# Patient Record
Sex: Female | Born: 1970 | Race: White | Hispanic: No | Marital: Married | Smoking: Never smoker
Health system: Southern US, Community
[De-identification: ages and names within clinical notes are randomized; demographics above are authoritative.]

## PROBLEM LIST (undated history)

## (undated) DIAGNOSIS — D649 Anemia, unspecified: Secondary | ICD-10-CM

## (undated) DIAGNOSIS — D242 Benign neoplasm of left breast: Secondary | ICD-10-CM

## (undated) DIAGNOSIS — Z87440 Personal history of urinary (tract) infections: Secondary | ICD-10-CM

## (undated) DIAGNOSIS — N939 Abnormal uterine and vaginal bleeding, unspecified: Secondary | ICD-10-CM

## (undated) HISTORY — DX: Abnormal uterine and vaginal bleeding, unspecified: N93.9

## (undated) HISTORY — DX: Benign neoplasm of left breast: D24.2

## (undated) HISTORY — PX: BREAST EXCISIONAL BIOPSY: SUR124

## (undated) HISTORY — DX: Personal history of urinary (tract) infections: Z87.440

## (undated) HISTORY — PX: BREAST SURGERY: SHX581

## (undated) HISTORY — DX: Anemia, unspecified: D64.9

---
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
## 2015-07-05 ENCOUNTER — Other Ambulatory Visit: Payer: Self-pay

## 2015-07-05 DIAGNOSIS — Z1231 Encounter for screening mammogram for malignant neoplasm of breast: Secondary | ICD-10-CM

## 2015-08-09 ENCOUNTER — Ambulatory Visit: Payer: Self-pay

## 2015-08-23 ENCOUNTER — Ambulatory Visit
Admission: RE | Admit: 2015-08-23 | Discharge: 2015-08-23 | Disposition: A | Discharge status: Home / Self Care | Payer: No Typology Code available for payment source | Source: Ambulatory Visit

## 2015-08-23 DIAGNOSIS — Z1231 Encounter for screening mammogram for malignant neoplasm of breast: Secondary | ICD-10-CM

## 2015-11-01 ENCOUNTER — Encounter: Payer: Self-pay | Admitting: Obstetrics & Gynecology

## 2015-11-01 ENCOUNTER — Ambulatory Visit (INDEPENDENT_AMBULATORY_CARE_PROVIDER_SITE_OTHER): Payer: No Typology Code available for payment source | Admitting: Obstetrics & Gynecology

## 2015-11-01 ENCOUNTER — Encounter: Payer: No Typology Code available for payment source | Admitting: Obstetrics & Gynecology

## 2015-11-01 VITALS — BP 118/62 | HR 80 | Temp 98.2°F | Resp 16 | Ht 65.25 in | Wt 156.0 lb

## 2015-11-01 DIAGNOSIS — Z124 Encounter for screening for malignant neoplasm of cervix: Secondary | ICD-10-CM

## 2015-11-01 DIAGNOSIS — N926 Irregular menstruation, unspecified: Secondary | ICD-10-CM

## 2015-11-01 DIAGNOSIS — M545 Low back pain: Secondary | ICD-10-CM | POA: Diagnosis not present

## 2015-11-01 LAB — FOLLICLE STIMULATING HORMONE: FSH: 9.4 m[IU]/mL

## 2015-11-01 LAB — TSH: TSH: 1.56 m[IU]/L

## 2015-11-01 LAB — HCG, SERUM, QUALITATIVE: Preg, Serum: NEGATIVE

## 2015-11-01 LAB — POCT URINE PREGNANCY: Preg Test, Ur: NEGATIVE

## 2015-11-01 NOTE — Patient Instructions (Signed)
[facility]: Almedia Balls MD  [address] [place], [place] [address]  [phone] 11/04/15 at [facility]. Deloris Ping will call you. 11:45 Check in

## 2015-11-01 NOTE — Progress Notes (Signed)
45 y.o. [id number] MarriedCaucasianF here for new patient visit and for episode of irregular cycle this month.  Pt reports her last normal menstrual cycle was 10/15/15.  This cycle lasted 4-5 days.  This is normal for her and her cycles are normally 28 days.  Then yesterday, her bleeding started back and she is having dark, old looking blood with clotting.  Also, about three weeks ago, she started having having severe low back pain.  She's having trouble with lifting, moving in bed, housework, even standing.  She has radiation of pain down outer thighs.  Pt also has fatigue and malaise over the last two weeks.  She denies fever.  Reports urinary urgency as well.  Patient's last menstrual period was 10/15/2015.          Sexually active: Yes.    The current method of family planning is condoms Every time.    Exercising: No.  The patient does not participate in regular exercise at present. Smoker:  no  Health Maintenance: Pap:  2007 Normal  History of abnormal Pap:  no MMG:  08/23/15 BIRADS1:neg Colonoscopy:  Never BMD:   Never TDaP:  UTD Screening Labs: ?, Urine today: neg   reports that she has never smoked. She has never used smokeless tobacco. She reports that she does not drink alcohol or use illicit drugs.  Past Medical History  Diagnosis Date  . Abnormal uterine bleeding   . Anemia   . 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 History of recurrent UTI (urinary tract infection)     Past Surgical History  Procedure Laterality Date  . Breast surgery Left 1990's    Fibroadenoma     Current Outpatient Prescriptions  Medication Sig Dispense Refill  . Multiple Vitamin (MULTIVITAMIN) tablet Take 1 tablet by mouth daily.     No current facility-administered medications for this visit.    Family History  Problem Relation Age of Onset  . Diabetes Mother   . Hypertension Mother   . Heart disease Father     ROS:  Pertinent items are noted in HPI.  Otherwise, a comprehensive ROS was  negative.  Exam:   BP 118/62 mmHg  Pulse 80  Temp(Src) 98.2 F (36.8 C) (Oral)  Resp 16  Ht 5' 5.25" (1.657 m)  Wt 156 lb (70.761 kg)  BMI 25.77 kg/m2  LMP 10/15/2015    Height: 5' 5.25" (165.7 cm)  Ht Readings from Last 3 Encounters:  11/01/15 5' 5.25" (1.657 m)    General appearance: alert, cooperative and appears stated age Head: Normocephalic, without obvious abnormality, atraumatic Neck: no adenopathy, supple, symmetrical, trachea midline and thyroid normal to inspection and palpation Lungs: clear to auscultation bilaterally Heart: regular rate and rhythm Abdomen: soft, non-tender; bowel sounds normal; no masses,  no organomegaly Extremities: extremities normal, atraumatic, no cyanosis or edema Skin: Skin color, texture, turgor normal. No rashes or lesions Lymph nodes: Cervical, supraclavicular, and axillary nodes normal. No abnormal inguinal nodes palpated Neurologic: Grossly normal   Pelvic: External genitalia:  no lesions              Urethra:  normal appearing urethra with no masses, tenderness or lesions              Bartholins and Skenes: normal                 Vagina: normal appearing vagina with normal color and discharge, no lesions  Cervix: no lesions              Pap taken: Yes.   Bimanual Exam:  Uterus:  normal size, contour, position, consistency, mobility, non-tender              Adnexa: normal adnexa and no mass, fullness, tenderness               Rectovaginal: Confirms               Anus:  normal sphincter tone, no lesions  Chaperone was DECLINED by pt.  A:  DUB Low back pain Fatigue and malaise for two weeks Mild anemia Urinary urgency, negative u/a today  P:   Mammogram yearly discussed pap smear with HR HPV obtained today FSH, TSH Return for PUS tomorrow Referral to Dr. Lynann Bologna.  Suspect low back issue is cause of pain and possibly urinary urgency. return annually or prn

## 2015-11-01 NOTE — Progress Notes (Signed)
Patient is scheduled to see Dr. Lynann Bologna 11-04-15 at 0830.  Ultrasound with Dr. Sabra Heck tomorrow at 1200.

## 2015-11-02 ENCOUNTER — Telehealth: Payer: Self-pay

## 2015-11-02 ENCOUNTER — Ambulatory Visit (INDEPENDENT_AMBULATORY_CARE_PROVIDER_SITE_OTHER): Payer: No Typology Code available for payment source | Admitting: Obstetrics & Gynecology

## 2015-11-02 ENCOUNTER — Ambulatory Visit (INDEPENDENT_AMBULATORY_CARE_PROVIDER_SITE_OTHER): Payer: No Typology Code available for payment source

## 2015-11-02 ENCOUNTER — Encounter: Payer: Self-pay | Admitting: Obstetrics & Gynecology

## 2015-11-02 VITALS — BP 116/78 | HR 88 | Resp 16 | Ht 65.25 in | Wt 154.0 lb

## 2015-11-02 DIAGNOSIS — N926 Irregular menstruation, unspecified: Secondary | ICD-10-CM

## 2015-11-02 DIAGNOSIS — M545 Low back pain: Secondary | ICD-10-CM

## 2015-11-02 DIAGNOSIS — D251 Intramural leiomyoma of uterus: Secondary | ICD-10-CM | POA: Diagnosis not present

## 2015-11-02 MED ORDER — NORETHINDRONE ACETATE 5 MG PO TABS: 10.0000 mg | ORAL_TABLET | Freq: Two times a day (BID) | ORAL | Status: AC

## 2015-11-02 NOTE — Telephone Encounter (Signed)
Spoke with patient at time of incoming call. Patient was seen in the office today for PUS with Nancy Barrera. States she was advised to take Aygestin 5 mg 1 tablet twice per day. When she picked up her prescription it states she needs to take Aygestin 5 mg take 2 tablets twice per day. Patient would like to clarify instructions. Advised I will speak with Dr.Miller and return call with additional recommendations. She is agreeable.

## 2015-11-02 NOTE — Progress Notes (Signed)
45 y.o. [id number] Married [place] female here for pelvic ultrasound due to DUB with menorrhagia this month.  Pt seen on yesterday with normal FSH, FSH and neg UPT.  She continues with bleeding today.  She denies SOB, dizziness, palpitations, or other new symptoms.  She is accompanied by her husband today.  Patient's last menstrual period was 10/15/2015.  Contraception: condom use  Findings:  UTERUS: 9.0 x 4.9 x 4.7cm with 3.0 x 2.4cm fibroids and 1.2 x 0.8cm fibroid EMS:3.67mm, symmetric ADNEXA: Left ovary: 2.9 x 1.7 x 1.6cm       Right ovary: 3.0 x 2.3 x 1.4cm CUL DE SAC: no free fluid  Discussion:  Images and findings reviewed with pt and her spouse.  As ultrasound and blood work were normal will start on Aygestin to try and get bleeding to stop.  She will continue this dosage until rx is finished.  At that point, she will stop the Aygestin and is to expect a menstrual cycle.  Will then need to see if this occurs again or if an isolated episode.  She may need to be on micronor or other OCP to control cycles if this occurs again.  Pt and spouse voice understanding.  Assessment:  DUB Uterine fibroids but otherwise normal PUS.  Do not feel biopsy is needed.  Plan:  Norethindrone 10mg  bid until rx is completed.  Then expect cycle.  Follow up 1 month. Pap result still pending.  ~15 minutes spent with patient >50% of time was in face to face discussion of above.

## 2015-11-02 NOTE — Telephone Encounter (Signed)
Spoke with patient. Advised of message as seen below from Dr.Miller. She is agreeable and verbalizes understanding.  Routing to provider for final review. Patient agreeable to disposition. Will close encounter.  

## 2015-11-02 NOTE — Telephone Encounter (Signed)
RX was for 10mg  bid so they may substitute with two 5mg  tabs.

## 2015-11-04 NOTE — Addendum Note (Signed)
Addended by: Megan Salon on: 11/04/2015 05:13 PM   Modules accepted: Orders

## 2015-11-09 LAB — IPS PAP TEST WITH HPV

## 2015-11-11 ENCOUNTER — Telehealth: Payer: Self-pay | Admitting: *Deleted

## 2015-11-11 NOTE — Telephone Encounter (Signed)
Notes Recorded by Elroy Channel, CMA on 11/11/2015 at 10:26 AM LM for pt to call back. Notes Recorded by Megan Salon, MD on 11/09/2015 at 5:34 PM Please let pt know that the HR HPV testing was negative. However, her pap could not be read due to blood. She should have a follow-up in one month for recheck of bleeding. I will repeat the pap then. 08 recall for one month please.

## 2015-11-11 NOTE — Telephone Encounter (Signed)
08 recall for f/u pap entered.

## 2015-11-12 ENCOUNTER — Encounter: Payer: Self-pay | Admitting: Obstetrics & Gynecology

## 2015-11-12 DIAGNOSIS — D251 Intramural leiomyoma of uterus: Secondary | ICD-10-CM | POA: Insufficient documentation

## 2015-11-15 NOTE — Telephone Encounter (Signed)
LM for pt to call back. Second attempt  

## 2015-11-15 NOTE — Telephone Encounter (Signed)
Pt notified. Verbalized understanding. Patient will call to schedule repeat pap appt.

## 2016-01-27 ENCOUNTER — Telehealth: Payer: Self-pay | Admitting: *Deleted

## 2016-01-27 NOTE — Telephone Encounter (Signed)
Called patient to follow up on repeat pap. Patient in office on 11/01/15 and pap smear was HR HPV negative, but unable to read due to blood. Dr. Sabra Heck wanted patient to have one month follow up. Multiple attempts made to contact patient and she stated she would call office to schedule. When speaking with patient today, she asked if she would be charged for visit and RN stated to patient she would have to pay her co pay, but would not be charged for repeat pap smear. Patient stated, " I don't need it, I have no problems today, I will come later." RN reiterated the fact that Dr. Sabra Heck wanted to repeat the pap smear, but patient again stated, "I have no problems, I will come later."

## 2016-01-27 NOTE — Telephone Encounter (Signed)
Please contact patient in regards to recall. She needs a PAP only appointment Thanks Margaretha Sheffield

## 2016-01-27 NOTE — Telephone Encounter (Signed)
Dr.Miller, please see message below.  Please advise on recall status Thanks Margaretha Sheffield

## 2016-01-28 ENCOUNTER — Encounter: Payer: Self-pay | Admitting: *Deleted

## 2016-01-28 NOTE — Telephone Encounter (Signed)
Please send letter that is now signed.

## 2016-01-28 NOTE — Telephone Encounter (Signed)
Reviewed with Dr Sabra Heck. Letter to your office for review.

## 2016-01-28 NOTE — Telephone Encounter (Signed)
She needs a letter about this.  Will route to sally to follow if pt returns after letter sent.  Ok to remove recall.

## 2017-02-23 ENCOUNTER — Other Ambulatory Visit: Payer: Self-pay | Admitting: Family Medicine

## 2017-02-23 DIAGNOSIS — Z1231 Encounter for screening mammogram for malignant neoplasm of breast: Secondary | ICD-10-CM

## 2017-03-12 ENCOUNTER — Ambulatory Visit
Admission: RE | Admit: 2017-03-12 | Discharge: 2017-03-12 | Disposition: A | Discharge status: Home / Self Care | Payer: No Typology Code available for payment source | Source: Ambulatory Visit | Attending: Family Medicine | Admitting: Family Medicine

## 2017-03-12 ENCOUNTER — Other Ambulatory Visit: Payer: Self-pay | Admitting: Physician Assistant

## 2017-03-12 DIAGNOSIS — Z1231 Encounter for screening mammogram for malignant neoplasm of breast: Secondary | ICD-10-CM

## 2018-02-11 ENCOUNTER — Other Ambulatory Visit: Payer: Self-pay | Admitting: Physician Assistant

## 2018-02-11 DIAGNOSIS — Z1231 Encounter for screening mammogram for malignant neoplasm of breast: Secondary | ICD-10-CM

## 2018-03-14 ENCOUNTER — Ambulatory Visit
Admission: RE | Admit: 2018-03-14 | Discharge: 2018-03-14 | Disposition: A | Discharge status: Home / Self Care | Payer: No Typology Code available for payment source | Source: Ambulatory Visit | Attending: Physician Assistant | Admitting: Physician Assistant

## 2018-03-14 ENCOUNTER — Other Ambulatory Visit: Payer: Self-pay | Admitting: Physician Assistant

## 2018-03-14 DIAGNOSIS — Z1231 Encounter for screening mammogram for malignant neoplasm of breast: Secondary | ICD-10-CM

## 2018-03-15 ENCOUNTER — Other Ambulatory Visit: Payer: Self-pay | Admitting: Physician Assistant

## 2018-03-15 DIAGNOSIS — R928 Other abnormal and inconclusive findings on diagnostic imaging of breast: Secondary | ICD-10-CM

## 2018-03-19 ENCOUNTER — Inpatient Hospital Stay
Admission: RE | Admit: 2018-03-19 | Discharge: 2018-03-19 | Disposition: A | Discharge status: Home / Self Care | Payer: No Typology Code available for payment source | Source: Ambulatory Visit | Attending: Physician Assistant | Admitting: Physician Assistant

## 2018-03-25 ENCOUNTER — Other Ambulatory Visit: Payer: Self-pay | Admitting: Physician Assistant

## 2018-03-25 ENCOUNTER — Ambulatory Visit
Admission: RE | Admit: 2018-03-25 | Discharge: 2018-03-25 | Disposition: A | Discharge status: Home / Self Care | Payer: No Typology Code available for payment source | Source: Ambulatory Visit | Attending: Physician Assistant | Admitting: Physician Assistant

## 2018-03-25 DIAGNOSIS — R928 Other abnormal and inconclusive findings on diagnostic imaging of breast: Secondary | ICD-10-CM

## 2018-03-25 DIAGNOSIS — N63 Unspecified lump in unspecified breast: Secondary | ICD-10-CM

## 2018-09-25 ENCOUNTER — Other Ambulatory Visit: Payer: No Typology Code available for payment source

## 2019-04-14 ENCOUNTER — Other Ambulatory Visit: Payer: Self-pay

## 2019-04-14 ENCOUNTER — Encounter (HOSPITAL_COMMUNITY): Payer: Self-pay | Admitting: Emergency Medicine

## 2019-04-14 ENCOUNTER — Emergency Department (HOSPITAL_COMMUNITY): Payer: No Typology Code available for payment source

## 2019-04-14 ENCOUNTER — Emergency Department (HOSPITAL_COMMUNITY)
Admission: EM | Admit: 2019-04-14 | Discharge: 2019-04-14 | Disposition: A | Discharge status: Home / Self Care | Payer: No Typology Code available for payment source | Attending: Emergency Medicine | Admitting: Emergency Medicine

## 2019-04-14 DIAGNOSIS — R112 Nausea with vomiting, unspecified: Secondary | ICD-10-CM | POA: Diagnosis present

## 2019-04-14 DIAGNOSIS — Z20828 Contact with and (suspected) exposure to other viral communicable diseases: Secondary | ICD-10-CM | POA: Diagnosis not present

## 2019-04-14 DIAGNOSIS — Z79899 Other long term (current) drug therapy: Secondary | ICD-10-CM | POA: Diagnosis not present

## 2019-04-14 DIAGNOSIS — K529 Noninfective gastroenteritis and colitis, unspecified: Secondary | ICD-10-CM | POA: Insufficient documentation

## 2019-04-14 LAB — CBC
HCT: 37.4 % (ref 36.0–46.0)
Hemoglobin: 11.7 g/dL — ABNORMAL LOW (ref 12.0–15.0)
MCH: 24.4 pg — ABNORMAL LOW (ref 26.0–34.0)
MCHC: 31.3 g/dL (ref 30.0–36.0)
MCV: 78.1 fL — ABNORMAL LOW (ref 80.0–100.0)
Platelets: 227 10*3/uL (ref 150–400)
RBC: 4.79 MIL/uL (ref 3.87–5.11)
RDW: 14.6 % (ref 11.5–15.5)
WBC: 15.2 10*3/uL — ABNORMAL HIGH (ref 4.0–10.5)
nRBC: 0 % (ref 0.0–0.2)

## 2019-04-14 LAB — COMPREHENSIVE METABOLIC PANEL
ALT: 24 U/L (ref 0–44)
AST: 22 U/L (ref 15–41)
Albumin: 3.7 g/dL (ref 3.5–5.0)
Alkaline Phosphatase: 43 U/L (ref 38–126)
Anion gap: 9 (ref 5–15)
BUN: 14 mg/dL (ref 6–20)
CO2: 24 mmol/L (ref 22–32)
Calcium: 8.4 mg/dL — ABNORMAL LOW (ref 8.9–10.3)
Chloride: 102 mmol/L (ref 98–111)
Creatinine, Ser: 0.84 mg/dL (ref 0.44–1.00)
GFR calc Af Amer: >60 mL/min (ref >60)
GFR calc non Af Amer: >60 mL/min (ref >60)
Glucose, Bld: 133 mg/dL — ABNORMAL HIGH (ref 70–99)
Potassium: 2.8 mmol/L — ABNORMAL LOW (ref 3.5–5.1)
Sodium: 135 mmol/L (ref 135–145)
Total Bilirubin: 1 mg/dL (ref 0.3–1.2)
Total Protein: 7.5 g/dL (ref 6.5–8.1)

## 2019-04-14 LAB — I-STAT BETA HCG BLOOD, ED (MC, WL, AP ONLY): I-stat hCG, quantitative: <5 m[IU]/mL (ref <5)

## 2019-04-14 LAB — LIPASE, BLOOD: Lipase: 31 U/L (ref 11–51)

## 2019-04-14 LAB — SARS CORONAVIRUS 2 BY RT PCR (HOSPITAL ORDER, PERFORMED IN ~~LOC~~ HOSPITAL LAB): SARS Coronavirus 2: NEGATIVE

## 2019-04-14 MED ORDER — ONDANSETRON HCL 4 MG/2ML IJ SOLN
4.0000 mg | Freq: Once | INTRAMUSCULAR | Status: DC
  Filled 2019-04-14: qty 2

## 2019-04-14 MED ORDER — SODIUM CHLORIDE (PF) 0.9 % IJ SOLN
INTRAMUSCULAR | Status: AC
  Filled 2019-04-14: qty 50

## 2019-04-14 MED ORDER — SODIUM CHLORIDE 0.9% FLUSH: 3.0000 mL | Freq: Once | INTRAVENOUS | Status: DC

## 2019-04-14 MED ORDER — SODIUM CHLORIDE 0.9 % IV BOLUS
1000.0000 mL | Freq: Once | INTRAVENOUS | Status: AC
  Administered 2019-04-14: 1000 mL via INTRAVENOUS

## 2019-04-14 MED ORDER — IOHEXOL 300 MG/ML  SOLN
100.0000 mL | Freq: Once | INTRAMUSCULAR | Status: AC | PRN
  Administered 2019-04-14: 03:00:00 100 mL via INTRAVENOUS

## 2019-04-14 MED ORDER — ONDANSETRON HCL 4 MG/2ML IJ SOLN
4.0000 mg | Freq: Once | INTRAMUSCULAR | Status: AC | PRN
  Administered 2019-04-14: 4 mg via INTRAVENOUS
  Filled 2019-04-14: qty 2

## 2019-04-14 MED ORDER — KETOROLAC TROMETHAMINE 30 MG/ML IJ SOLN
30.0000 mg | Freq: Once | INTRAMUSCULAR | Status: AC
  Administered 2019-04-14: 03:00:00 30 mg via INTRAVENOUS
  Filled 2019-04-14: qty 1

## 2019-04-14 MED ORDER — ONDANSETRON 8 MG PO TBDP: ORAL_TABLET | ORAL | 0 refills | Status: DC

## 2019-04-14 NOTE — ED Provider Notes (Signed)
[facility] DEPT Provider Note   CSN: [id number] Arrival date & time: 04/14/19  0057     History   Chief Complaint Chief Complaint  Patient presents with  . Fever  . Emesis    HPI Nancy Barrera is a 48 y.o. female.     Patient is a 48 year old female with past medical history of recurrent UTIs, uterine fibroids, and anemia.  She presents today for evaluation of abdominal pain, nausea, and vomiting.  This began acutely this afternoon after eating lunch.  No when she dined with had similar symptoms.  Patient describes generalized abdominal cramping along with multiple episodes of vomiting that have been nonbilious and nonbloody.  Patient has had fever up to 103 today prior to coming here.  Symptoms are worsened by attempting to eat or drink.  There are no alleviating factors.  The history is provided by the patient.    Past Medical History:  Diagnosis Date  . Abnormal uterine bleeding   . Anemia   . 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 History of recurrent UTI (urinary tract infection)     Patient Active Problem List   Diagnosis Date Noted  . Fibroids, intramural 11/12/2015    Past Surgical History:  Procedure Laterality Date  . BREAST EXCISIONAL BIOPSY Left   . BREAST SURGERY Left 1990's   Fibroadenoma      OB History    Gravida  2   Para  2   Term  2   Preterm      AB      Living  2     SAB      TAB      Ectopic      Multiple      Live Births  2            Home Medications    Prior to Admission medications   Medication Sig Start Date End Date Taking? Authorizing Provider  Multiple Vitamin (MULTIVITAMIN) tablet Take 1 tablet by mouth daily.    [provider]  norethindrone (AYGESTIN) 5 MG tablet Take 2 tablets (10 mg total) by mouth 2 (two) times daily. Take for 8 days. 11/02/15   Megan Salon, MD    Family History Family History  Problem Relation Age of Onset  . Diabetes Mother   .  Hypertension Mother   . Heart disease Father     Social History Social History   Tobacco Use  . Smoking status: Never Smoker  . Smokeless tobacco: Never Used  Substance Use Topics  . Alcohol use: No  . Drug use: No     Allergies   Penicillins and Sulfa antibiotics   Review of Systems Review of Systems  All other systems reviewed and are negative.    Physical Exam Updated Vital Signs BP (!) 99/58 (BP Location: Left Arm)   Pulse 87   Temp 99.4 F (37.4 C) (Oral)   Resp 15   Ht 5\' 5"  (1.651 m)   Wt 73.9 kg   LMP 04/07/2019   SpO2 96%   BMI 27.12 kg/m   Physical Exam Vitals signs and nursing note reviewed.  Constitutional:      General: She is not in acute distress.    Appearance: She is well-developed. She is not diaphoretic.  HENT:     Head: Normocephalic and atraumatic.  Neck:     Musculoskeletal: Normal range of motion and neck supple.  Cardiovascular:     Rate and  Rhythm: Normal rate and regular rhythm.     Heart sounds: No murmur. No friction rub. No gallop.   Pulmonary:     Effort: Pulmonary effort is normal. No respiratory distress.     Breath sounds: Normal breath sounds. No wheezing.  Abdominal:     General: Bowel sounds are normal. There is no distension.     Palpations: Abdomen is soft.     Tenderness: There is abdominal tenderness. There is no right CVA tenderness, left CVA tenderness, guarding or rebound.     Comments: There is tenderness to palpation across the lower abdomen in the left lower quadrant, suprapubic region, and right lower quadrant.  Musculoskeletal: Normal range of motion.  Skin:    General: Skin is warm and dry.  Neurological:     Mental Status: She is alert and oriented to person, place, and time.      ED Treatments / Results  Labs (all labs ordered are listed, but only abnormal results are displayed) Labs Reviewed  SARS CORONAVIRUS 2 ([facility])  LIPASE, BLOOD   COMPREHENSIVE METABOLIC PANEL  CBC  URINALYSIS, ROUTINE W REFLEX MICROSCOPIC  I-STAT BETA HCG BLOOD, ED ([facility], [facility], [facility] ONLY)    EKG None  Radiology No results found.  Procedures Procedures (including critical care time)  Medications Ordered in ED Medications  sodium chloride flush (NS) 0.9 % injection 3 mL (has no administration in time range)  sodium chloride 0.9 % bolus 1,000 mL (has no administration in time range)  ondansetron (ZOFRAN) injection 4 mg (has no administration in time range)  ketorolac (TORADOL) 30 MG/ML injection 30 mg (has no administration in time range)  ondansetron (ZOFRAN) injection 4 mg (4 mg Intravenous Given 04/14/19 0235)     Initial Impression / Assessment and Plan / ED Course  I have reviewed the triage vital signs and the nursing notes.  Pertinent labs & imaging results that were available during my care of the patient were reviewed by me and considered in my medical decision making (see chart for details).  Patient presenting with complaints of abdominal cramping, nausea, vomiting, and fever.  This started shortly after having lunch this afternoon.  Patient symptoms are most likely viral in nature.  She was given IV fluids and medication for pain and nausea and seems to be feeling better.  CT scan shows no acute intra-abdominal pathology.  Patient was also concerned about the possibility of COVID-19.  She was tested for this as well and was negative.  At this point, I feel as though the patient is appropriate for discharge.  She is to drink clear liquids for the next 12 hours, then advance as tolerated.  She will be given zofran to use for nausea as needed.  Final Clinical Impressions(s) / ED Diagnoses   Final diagnoses:  None    ED Discharge Orders    None       Veryl Speak, MD 04/14/19 [phone]

## 2019-04-14 NOTE — Discharge Instructions (Addendum)
Zofran as prescribed as needed for nausea.  Clear liquid diet for the next 12 hours, then slowly advance to normal as tolerated.  Return to the emergency department for worsening abdominal pain, high fever, bloody stool, or other new and concerning symptoms.

## 2019-04-14 NOTE — ED Triage Notes (Signed)
Pt presents with vomiting and fever of up to 103. Pt spouse reports giving tylenol at 48 and pt had prior episodes of emesis.

## 2019-06-09 ENCOUNTER — Other Ambulatory Visit: Payer: Self-pay | Admitting: Physician Assistant

## 2019-06-09 DIAGNOSIS — N6320 Unspecified lump in the left breast, unspecified quadrant: Secondary | ICD-10-CM

## 2019-06-18 ENCOUNTER — Ambulatory Visit
Admission: RE | Admit: 2019-06-18 | Discharge: 2019-06-18 | Disposition: A | Discharge status: Home / Self Care | Payer: No Typology Code available for payment source | Source: Ambulatory Visit | Attending: Physician Assistant | Admitting: Physician Assistant

## 2019-06-18 ENCOUNTER — Other Ambulatory Visit: Payer: Self-pay

## 2019-06-18 DIAGNOSIS — N6320 Unspecified lump in the left breast, unspecified quadrant: Secondary | ICD-10-CM

## 2020-09-08 ENCOUNTER — Emergency Department (HOSPITAL_COMMUNITY): Payer: No Typology Code available for payment source

## 2020-09-08 ENCOUNTER — Emergency Department (HOSPITAL_COMMUNITY)
Admission: EM | Admit: 2020-09-08 | Discharge: 2020-09-08 | Disposition: A | Discharge status: Home / Self Care | Payer: No Typology Code available for payment source | Attending: Emergency Medicine | Admitting: Emergency Medicine

## 2020-09-08 ENCOUNTER — Other Ambulatory Visit: Payer: Self-pay

## 2020-09-08 ENCOUNTER — Encounter (HOSPITAL_COMMUNITY): Payer: Self-pay

## 2020-09-08 DIAGNOSIS — E876 Hypokalemia: Secondary | ICD-10-CM | POA: Diagnosis not present

## 2020-09-08 DIAGNOSIS — R319 Hematuria, unspecified: Secondary | ICD-10-CM | POA: Diagnosis not present

## 2020-09-08 DIAGNOSIS — R109 Unspecified abdominal pain: Secondary | ICD-10-CM

## 2020-09-08 LAB — CBC
HCT: 36.3 % (ref 36.0–46.0)
Hemoglobin: 10.6 g/dL — ABNORMAL LOW (ref 12.0–15.0)
MCH: 22 pg — ABNORMAL LOW (ref 26.0–34.0)
MCHC: 29.2 g/dL — ABNORMAL LOW (ref 30.0–36.0)
MCV: 75.5 fL — ABNORMAL LOW (ref 80.0–100.0)
Platelets: 335 10*3/uL (ref 150–400)
RBC: 4.81 MIL/uL (ref 3.87–5.11)
RDW: 15 % (ref 11.5–15.5)
WBC: 8.8 10*3/uL (ref 4.0–10.5)
nRBC: 0 % (ref 0.0–0.2)

## 2020-09-08 LAB — COMPREHENSIVE METABOLIC PANEL
ALT: 21 U/L (ref 0–44)
AST: 20 U/L (ref 15–41)
Albumin: 3.9 g/dL (ref 3.5–5.0)
Alkaline Phosphatase: 51 U/L (ref 38–126)
Anion gap: 9 (ref 5–15)
BUN: 12 mg/dL (ref 6–20)
CO2: 26 mmol/L (ref 22–32)
Calcium: 8.8 mg/dL — ABNORMAL LOW (ref 8.9–10.3)
Chloride: 105 mmol/L (ref 98–111)
Creatinine, Ser: 0.64 mg/dL (ref 0.44–1.00)
GFR, Estimated: >60 mL/min (ref >60)
Glucose, Bld: 102 mg/dL — ABNORMAL HIGH (ref 70–99)
Potassium: 3.2 mmol/L — ABNORMAL LOW (ref 3.5–5.1)
Sodium: 140 mmol/L (ref 135–145)
Total Bilirubin: 0.4 mg/dL (ref 0.3–1.2)
Total Protein: 7.8 g/dL (ref 6.5–8.1)

## 2020-09-08 LAB — URINALYSIS, ROUTINE W REFLEX MICROSCOPIC
Bilirubin Urine: NEGATIVE
Glucose, UA: NEGATIVE mg/dL
Ketones, ur: NEGATIVE mg/dL
Leukocytes,Ua: NEGATIVE
Nitrite: NEGATIVE
Protein, ur: NEGATIVE mg/dL
Specific Gravity, Urine: 1.01 (ref 1.005–1.030)
pH: 5 (ref 5.0–8.0)

## 2020-09-08 LAB — I-STAT BETA HCG BLOOD, ED (MC, WL, AP ONLY): I-stat hCG, quantitative: <5 m[IU]/mL (ref <5)

## 2020-09-08 LAB — LIPASE, BLOOD: Lipase: 35 U/L (ref 11–51)

## 2020-09-08 MED ORDER — ONDANSETRON 8 MG PO TBDP: 8.0000 mg | ORAL_TABLET | Freq: Three times a day (TID) | ORAL | 0 refills | Status: DC | PRN

## 2020-09-08 MED ORDER — HYDROCODONE-ACETAMINOPHEN 5-325 MG PO TABS: 1.0000 | ORAL_TABLET | ORAL | 0 refills | Status: DC | PRN

## 2020-09-08 NOTE — ED Triage Notes (Signed)
Pt presents with c/o abdominal pain and left flank pain that started at work earlier today. Pt also reports she vomited twice once the pain started. Pt concerned about a kidney stone.

## 2020-09-08 NOTE — ED Notes (Signed)
Patient has a culture in the main lab

## 2020-09-08 NOTE — ED Provider Notes (Signed)
[facility] DEPT Provider Note   CSN: [id number] Arrival date & time: 09/08/20  1235     History Chief Complaint  Patient presents with  . Abdominal Pain  . Flank Pain    Nancy Barrera is a 50 y.o. female.  50 year old female presents with intermittent colicky left flank pain.  Patient is concerned she may have a kidney stone.  Denies any fever or chills.  Patient stated that the pain was so intense I did make her have emesis.  Denies any URI symptoms.  Does have history of kidney stones in the past.  Denies any hematuria.  Nothing major symptoms better or worse no treatment use prior to arrival        Past Medical History:  Diagnosis Date  . Abnormal uterine bleeding   . Anemia   . 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 History of recurrent UTI (urinary tract infection)     Patient Active Problem List   Diagnosis Date Noted  . Fibroids, intramural 11/12/2015    Past Surgical History:  Procedure Laterality Date  . BREAST EXCISIONAL BIOPSY Left   . BREAST SURGERY Left 1990's   Fibroadenoma      OB History    Gravida  2   Para  2   Term  2   Preterm      AB      Living  2     SAB      IAB      Ectopic      Multiple      Live Births  2           Family History  Problem Relation Age of Onset  . Diabetes Mother   . Hypertension Mother   . Heart disease Father     Social History   Tobacco Use  . Smoking status: Never Smoker  . Smokeless tobacco: Never Used  Substance Use Topics  . Alcohol use: No  . Drug use: No    Home Medications Prior to Admission medications   Medication Sig Start Date End Date Taking? Authorizing Provider  Multiple Vitamin (MULTIVITAMIN) tablet Take 1 tablet by mouth daily.    [provider]  norethindrone (AYGESTIN) 5 MG tablet Take 2 tablets (10 mg total) by mouth 2 (two) times daily. Take for 8 days. 11/02/15   Megan Salon, MD  ondansetron (ZOFRAN ODT) 8 MG  disintegrating tablet 8mg  ODT q4 hours prn nausea 04/14/19   Veryl Speak, MD    Allergies    Penicillins and Sulfa antibiotics  Review of Systems   Review of Systems  All other systems reviewed and are negative.   Physical Exam Updated Vital Signs BP 129/84 (BP Location: Left Arm)   Pulse 72   Temp 98.5 F (36.9 C) (Oral)   Resp 16   LMP 09/01/2020 (Approximate)   SpO2 98%   Physical Exam Vitals and nursing note reviewed.  Constitutional:      General: She is not in acute distress.    Appearance: Normal appearance. She is well-developed and well-nourished. She is not toxic-appearing.  HENT:     Head: Normocephalic and atraumatic.  Eyes:     General: Lids are normal.     Extraocular Movements: EOM normal.     Conjunctiva/sclera: Conjunctivae normal.     Pupils: Pupils are equal, round, and reactive to light.  Neck:     Thyroid: No thyroid mass.     Trachea: No tracheal  deviation.  Cardiovascular:     Rate and Rhythm: Normal rate and regular rhythm.     Heart sounds: Normal heart sounds. No murmur heard. No gallop.   Pulmonary:     Effort: Pulmonary effort is normal. No respiratory distress.     Breath sounds: Normal breath sounds. No stridor. No decreased breath sounds, wheezing, rhonchi or rales.  Abdominal:     General: Bowel sounds are normal. There is no distension.     Palpations: Abdomen is soft.     Tenderness: There is no abdominal tenderness. There is no CVA tenderness, left CVA tenderness or rebound.  Musculoskeletal:        General: No tenderness or edema. Normal range of motion.     Cervical back: Normal range of motion and neck supple.  Skin:    General: Skin is warm and dry.     Findings: No abrasion or rash.  Neurological:     Mental Status: She is alert and oriented to person, place, and time.     GCS: GCS eye subscore is 4. GCS verbal subscore is 5. GCS motor subscore is 6.     Cranial Nerves: No cranial nerve deficit.     Sensory: No sensory  deficit.     Deep Tendon Reflexes: Strength normal.  Psychiatric:        Mood and Affect: Mood and affect normal.        Speech: Speech normal.        Behavior: Behavior normal.     ED Results / Procedures / Treatments   Labs (all labs ordered are listed, but only abnormal results are displayed) Labs Reviewed  COMPREHENSIVE METABOLIC PANEL - Abnormal; Notable for the following components:      Result Value   Potassium 3.2 (*)    Glucose, Bld 102 (*)    Calcium 8.8 (*)    All other components within normal limits  CBC - Abnormal; Notable for the following components:   Hemoglobin 10.6 (*)    MCV 75.5 (*)    MCH 22.0 (*)    MCHC 29.2 (*)    All other components within normal limits  URINALYSIS, ROUTINE W REFLEX MICROSCOPIC - Abnormal; Notable for the following components:   Hgb urine dipstick MODERATE (*)    Bacteria, UA RARE (*)    All other components within normal limits  LIPASE, BLOOD  I-STAT BETA HCG BLOOD, ED ([facility], [facility], [facility] ONLY)    EKG None  Radiology CT Renal Stone Study  Result Date: 09/08/2020 CLINICAL DATA:  Abdominal and left flank pain beginning today. EXAM: CT ABDOMEN AND PELVIS WITHOUT CONTRAST TECHNIQUE: Multidetector CT imaging of the abdomen and pelvis was performed following the standard protocol without IV contrast. COMPARISON:  04/14/2019 FINDINGS: Lower chest: Stable peripheral scar in the lateral left lower lobe. No active chest pathology. Hepatobiliary: Liver parenchyma is normal. At least 1 calcified stone dependent in the gallbladder. No evidence of cholecystitis or obstruction by CT. Pancreas: Normal Spleen: Normal Adrenals/Urinary Tract: Adrenal glands are normal. Kidneys are normal. No evidence of stone, mass or hydronephrosis. No stone seen along the course of either ureter. No stone in the bladder. Stomach/Bowel: Normal Vascular/Lymphatic: Normal Reproductive: Normal with the exception of a mass at the posterior lower uterine segment most consistent  with a leiomyoma. This is similar in size to the study of 18 months ago, measuring about 4 cm. Other: No free fluid or air. 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 IMPRESSION: 1. No  acute finding by CT. No evidence of urinary tract stone disease or other urinary tract pathology. 2. At least 1 calcified stone dependent in the gallbladder. No evidence of cholecystitis or obstruction by CT. 3. 4 cm leiomyoma of the posterior lower uterine segment, similar in size to the study of 18 months ago. 4. 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 Electronically Signed   By: Nelson Chimes M.D.   On: 09/08/2020 15:50    Procedures Procedures   Medications Ordered in ED Medications - No data to display  ED Course  I have reviewed the triage vital signs and the nursing notes.  Pertinent labs & imaging results that were available during my care of the patient were reviewed by me and considered in my medical decision making (see chart for details).    MDM Rules/Calculators/A&P                          CT negative for kidney stone.  Urinalysis does show hematuria.  Concern that patient could have passed a kidney stone.  Mild hypokalemia noted and patient started to eat bananas.  Will treat with pain medication antiemetics and give urology referral Final Clinical Impression(s) / ED Diagnoses Final diagnoses:  None    Rx / DC Orders ED Discharge Orders    None       Lacretia Leigh, MD 09/08/20 1656

## 2020-09-28 ENCOUNTER — Other Ambulatory Visit: Payer: Self-pay | Admitting: Physician Assistant

## 2020-09-28 DIAGNOSIS — N6320 Unspecified lump in the left breast, unspecified quadrant: Secondary | ICD-10-CM

## 2020-11-10 ENCOUNTER — Ambulatory Visit
Admission: RE | Admit: 2020-11-10 | Discharge: 2020-11-10 | Disposition: A | Discharge status: Home / Self Care | Payer: No Typology Code available for payment source | Source: Ambulatory Visit | Attending: Physician Assistant | Admitting: Physician Assistant

## 2020-11-10 ENCOUNTER — Other Ambulatory Visit: Payer: Self-pay | Admitting: Physician Assistant

## 2020-11-10 ENCOUNTER — Other Ambulatory Visit: Payer: Self-pay

## 2020-11-10 DIAGNOSIS — N6320 Unspecified lump in the left breast, unspecified quadrant: Secondary | ICD-10-CM

## 2021-05-16 ENCOUNTER — Other Ambulatory Visit: Payer: Self-pay | Admitting: Adult Health Nurse Practitioner

## 2021-05-16 DIAGNOSIS — N6320 Unspecified lump in the left breast, unspecified quadrant: Secondary | ICD-10-CM

## 2021-05-17 ENCOUNTER — Inpatient Hospital Stay: Admission: RE | Admit: 2021-05-17 | Payer: No Typology Code available for payment source | Source: Ambulatory Visit

## 2021-06-07 ENCOUNTER — Emergency Department (HOSPITAL_BASED_OUTPATIENT_CLINIC_OR_DEPARTMENT_OTHER): Payer: No Typology Code available for payment source

## 2021-06-07 ENCOUNTER — Emergency Department (HOSPITAL_BASED_OUTPATIENT_CLINIC_OR_DEPARTMENT_OTHER)
Admission: EM | Admit: 2021-06-07 | Discharge: 2021-06-07 | Disposition: A | Discharge status: Home / Self Care | Payer: No Typology Code available for payment source | Attending: Emergency Medicine | Admitting: Emergency Medicine

## 2021-06-07 ENCOUNTER — Encounter (HOSPITAL_BASED_OUTPATIENT_CLINIC_OR_DEPARTMENT_OTHER): Payer: Self-pay | Admitting: *Deleted

## 2021-06-07 ENCOUNTER — Other Ambulatory Visit: Payer: Self-pay

## 2021-06-07 DIAGNOSIS — Y92410 Unspecified street and highway as the place of occurrence of the external cause: Secondary | ICD-10-CM | POA: Insufficient documentation

## 2021-06-07 DIAGNOSIS — M25512 Pain in left shoulder: Secondary | ICD-10-CM | POA: Insufficient documentation

## 2021-06-07 DIAGNOSIS — S161XXA Strain of muscle, fascia and tendon at neck level, initial encounter: Secondary | ICD-10-CM | POA: Diagnosis not present

## 2021-06-07 DIAGNOSIS — S0990XA Unspecified injury of head, initial encounter: Secondary | ICD-10-CM | POA: Insufficient documentation

## 2021-06-07 MED ORDER — METHOCARBAMOL 500 MG PO TABS: 1000.0000 mg | ORAL_TABLET | Freq: Two times a day (BID) | ORAL | 0 refills | Status: AC

## 2021-06-07 MED ORDER — ACETAMINOPHEN 325 MG PO TABS: 650.0000 mg | ORAL_TABLET | Freq: Four times a day (QID) | ORAL | 0 refills | Status: DC | PRN

## 2021-06-07 MED ORDER — ACETAMINOPHEN 325 MG PO TABS
650.0000 mg | ORAL_TABLET | Freq: Once | ORAL | Status: AC
  Administered 2021-06-07: 650 mg via ORAL
  Filled 2021-06-07: qty 2

## 2021-06-07 MED ORDER — ACETAMINOPHEN 325 MG PO TABS: 650.0000 mg | ORAL_TABLET | Freq: Four times a day (QID) | ORAL | 0 refills | Status: AC | PRN

## 2021-06-07 MED ORDER — LIDOCAINE 5 % EX PTCH: 1.0000 | MEDICATED_PATCH | Freq: Every day | CUTANEOUS | 0 refills | Status: AC | PRN

## 2021-06-07 NOTE — Discharge Instructions (Addendum)
Based on the events which brought you to the ER today, it is possible that you may have a concussion. A concussion occurs when there is a blow to the head or body, with enough force to shake the brain and disrupt how the brain functions. You may experience symptoms such as headaches, sensitivity to light/noise, dizziness, cognitive slowing, difficulty concentrating / remembering, trouble sleeping and drowsiness. These symptoms may last anywhere from hours/days to potentially weeks/months. While these symptoms are very frustrating and perhaps debilitating, it is important that you remember that they will improve over time. Everyone has a different rate of recovery; it is difficult to predict when your symptoms will resolve. In order to allow for your brain to heal after the injury, we recommend that you see your primary physician or a physician knowledgeable in concussion management. We also advise you to let your body and brain rest: avoid physical activities (sports, gym, and exercise) and reduce cognitive demands (reading, texting, TV watching, computer use, video games, etc). School attendance, after-school activities and work may need to be modified to avoid increasing symptoms. We recommend against driving until until all symptoms have resolved. You should take 650mg  of Acetaminophen (Tylenol) every 4 hours as needed for pain control; however, taking anti-inflammatory medication (Motrin/Advil/Ibuprofen) is not advised. Come back to the ER right away if you are having repeated episodes of vomiting, severe/worsening headache/dizziness or any other symptom that alarms you. We recommended that someone stay with you for the next 24 hours to monitor for these worrisome symptoms.

## 2021-06-07 NOTE — ED Notes (Signed)
Ccollar removed per provider order

## 2021-06-07 NOTE — ED Notes (Signed)
ED Provider at bedside. 
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
## 2021-09-13 ENCOUNTER — Emergency Department (HOSPITAL_COMMUNITY)
Admission: EM | Admit: 2021-09-13 | Discharge: 2021-09-14 | Disposition: A | Discharge status: Home / Self Care | Payer: No Typology Code available for payment source | Attending: Emergency Medicine | Admitting: Emergency Medicine

## 2021-09-13 ENCOUNTER — Other Ambulatory Visit: Payer: Self-pay

## 2021-09-13 ENCOUNTER — Emergency Department (HOSPITAL_COMMUNITY): Payer: No Typology Code available for payment source

## 2021-09-13 ENCOUNTER — Encounter (HOSPITAL_COMMUNITY): Payer: Self-pay

## 2021-09-13 DIAGNOSIS — R110 Nausea: Secondary | ICD-10-CM | POA: Insufficient documentation

## 2021-09-13 DIAGNOSIS — I10 Essential (primary) hypertension: Secondary | ICD-10-CM | POA: Diagnosis not present

## 2021-09-13 DIAGNOSIS — R0602 Shortness of breath: Secondary | ICD-10-CM | POA: Insufficient documentation

## 2021-09-13 DIAGNOSIS — Z5321 Procedure and treatment not carried out due to patient leaving prior to being seen by health care provider: Secondary | ICD-10-CM | POA: Insufficient documentation

## 2021-09-13 DIAGNOSIS — R079 Chest pain, unspecified: Secondary | ICD-10-CM | POA: Insufficient documentation

## 2021-09-13 LAB — CBC WITH DIFFERENTIAL/PLATELET
Abs Immature Granulocytes: 0.02 10*3/uL (ref 0.00–0.07)
Basophils Absolute: 0.1 10*3/uL (ref 0.0–0.1)
Basophils Relative: 1 %
Eosinophils Absolute: 0.2 10*3/uL (ref 0.0–0.5)
Eosinophils Relative: 2 %
HCT: 38.4 % (ref 36.0–46.0)
Hemoglobin: 11.6 g/dL — ABNORMAL LOW (ref 12.0–15.0)
Immature Granulocytes: 0 %
Lymphocytes Relative: 34 %
Lymphs Abs: 2.8 10*3/uL (ref 0.7–4.0)
MCH: 22.7 pg — ABNORMAL LOW (ref 26.0–34.0)
MCHC: 30.2 g/dL (ref 30.0–36.0)
MCV: 75 fL — ABNORMAL LOW (ref 80.0–100.0)
Monocytes Absolute: 0.6 10*3/uL (ref 0.1–1.0)
Monocytes Relative: 8 %
Neutro Abs: 4.5 10*3/uL (ref 1.7–7.7)
Neutrophils Relative %: 55 %
Platelets: 299 10*3/uL (ref 150–400)
RBC: 5.12 MIL/uL — ABNORMAL HIGH (ref 3.87–5.11)
RDW: 15.5 % (ref 11.5–15.5)
WBC: 8.2 10*3/uL (ref 4.0–10.5)
nRBC: 0 % (ref 0.0–0.2)

## 2021-09-13 LAB — BASIC METABOLIC PANEL
Anion gap: 8 (ref 5–15)
BUN: 16 mg/dL (ref 6–20)
CO2: 24 mmol/L (ref 22–32)
Calcium: 9.1 mg/dL (ref 8.9–10.3)
Chloride: 105 mmol/L (ref 98–111)
Creatinine, Ser: 0.63 mg/dL (ref 0.44–1.00)
GFR, Estimated: >60 mL/min (ref >60)
Glucose, Bld: 104 mg/dL — ABNORMAL HIGH (ref 70–99)
Potassium: 3.4 mmol/L — ABNORMAL LOW (ref 3.5–5.1)
Sodium: 137 mmol/L (ref 135–145)

## 2021-09-13 LAB — TROPONIN I (HIGH SENSITIVITY): Troponin I (High Sensitivity): 2 ng/L (ref <18)

## 2021-09-13 NOTE — ED Triage Notes (Signed)
Pt states that she has been having chest pain, SHOB, and HTN since today.

## 2021-09-13 NOTE — ED Provider Triage Note (Signed)
Emergency Medicine Provider Triage Evaluation Note  Nancy Barrera , a 51 y.o. female  was evaluated in triage.  Pt complains of chest pain and shortness of breath of 1 hour duration.  Chest pain is nonradiating associated with nausea.  Denies vomiting, diaphoresis, palpitation, abdominal pain.  Patient states she had a visit with primary care provider and had an elevated systolic pressure of 960A.  Patient following work checked her blood pressure at home and it was 170s.  So patient decided to present to the emergency room.  Denies history of elevated blood pressure.  Does not take antihypertensive.  States she typically has soft blood pressure.  Denies visual change.  Review of Systems  Positive: As above Negative: As above  Physical Exam  BP (!) 185/95    Pulse 76    Temp 97.8 F (36.6 C) (Oral)    Resp 18    SpO2 100%  Gen:   Awake, no distress   Resp:  Normal effort  MSK:   Moves extremities without difficulty  Other:    Medical Decision Making  Medically screening exam initiated at 10:26 PM.  Appropriate orders placed.  Nancy Barrera was informed that the remainder of the evaluation will be completed by another provider, this initial triage assessment does not replace that evaluation, and the importance of remaining in the ED until their evaluation is complete.     Evlyn Courier, PA-C 09/13/21 2228

## 2024-01-26 IMAGING — CR DG CHEST 2V
2 series · 2 of 2 positions shown · non-contrast
Comparison: 06/07/2021

CLINICAL DATA: Mid chest pain and shortness of breath for 1 day

EXAM:
CHEST - 2 VIEW

[w chest pa]
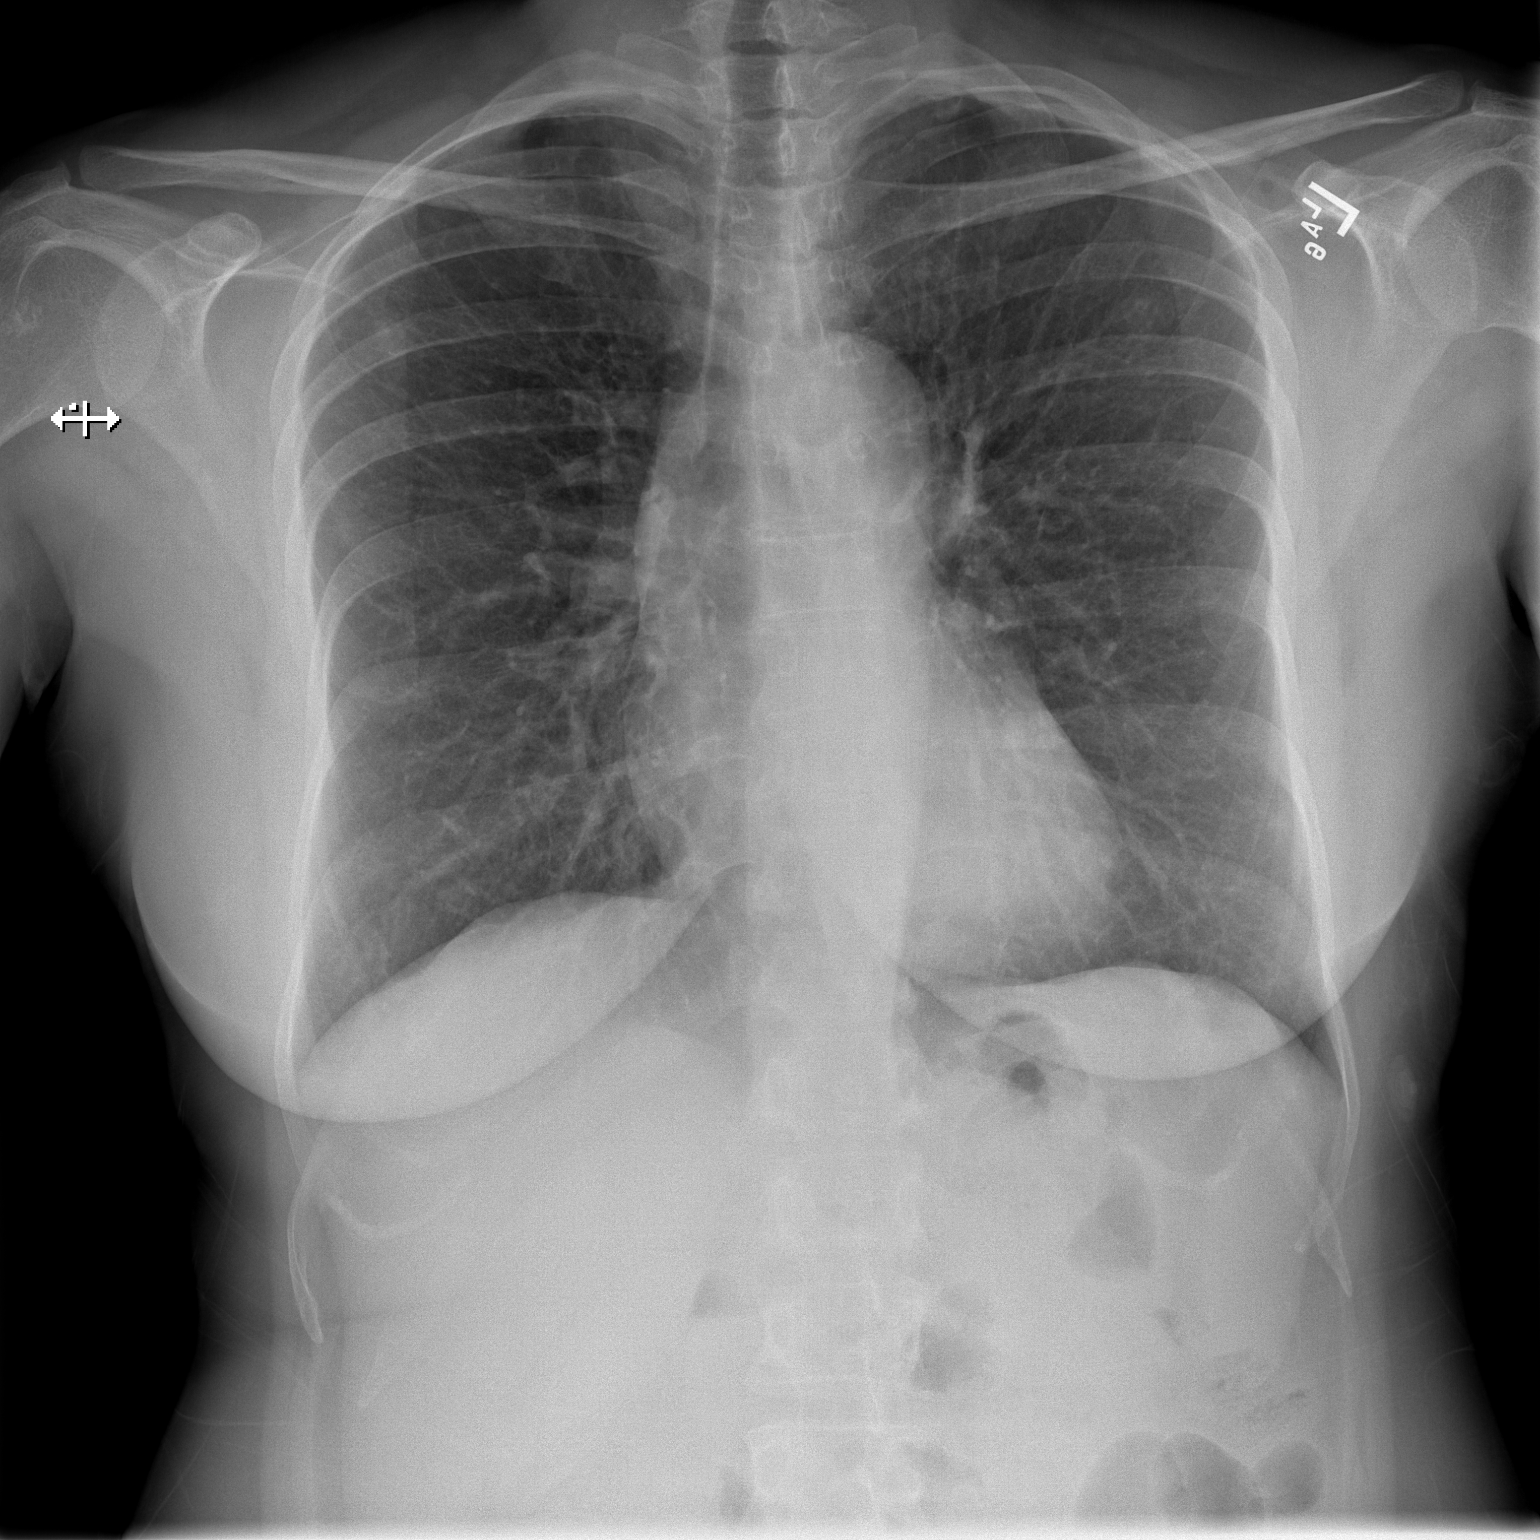

[w chest lat]
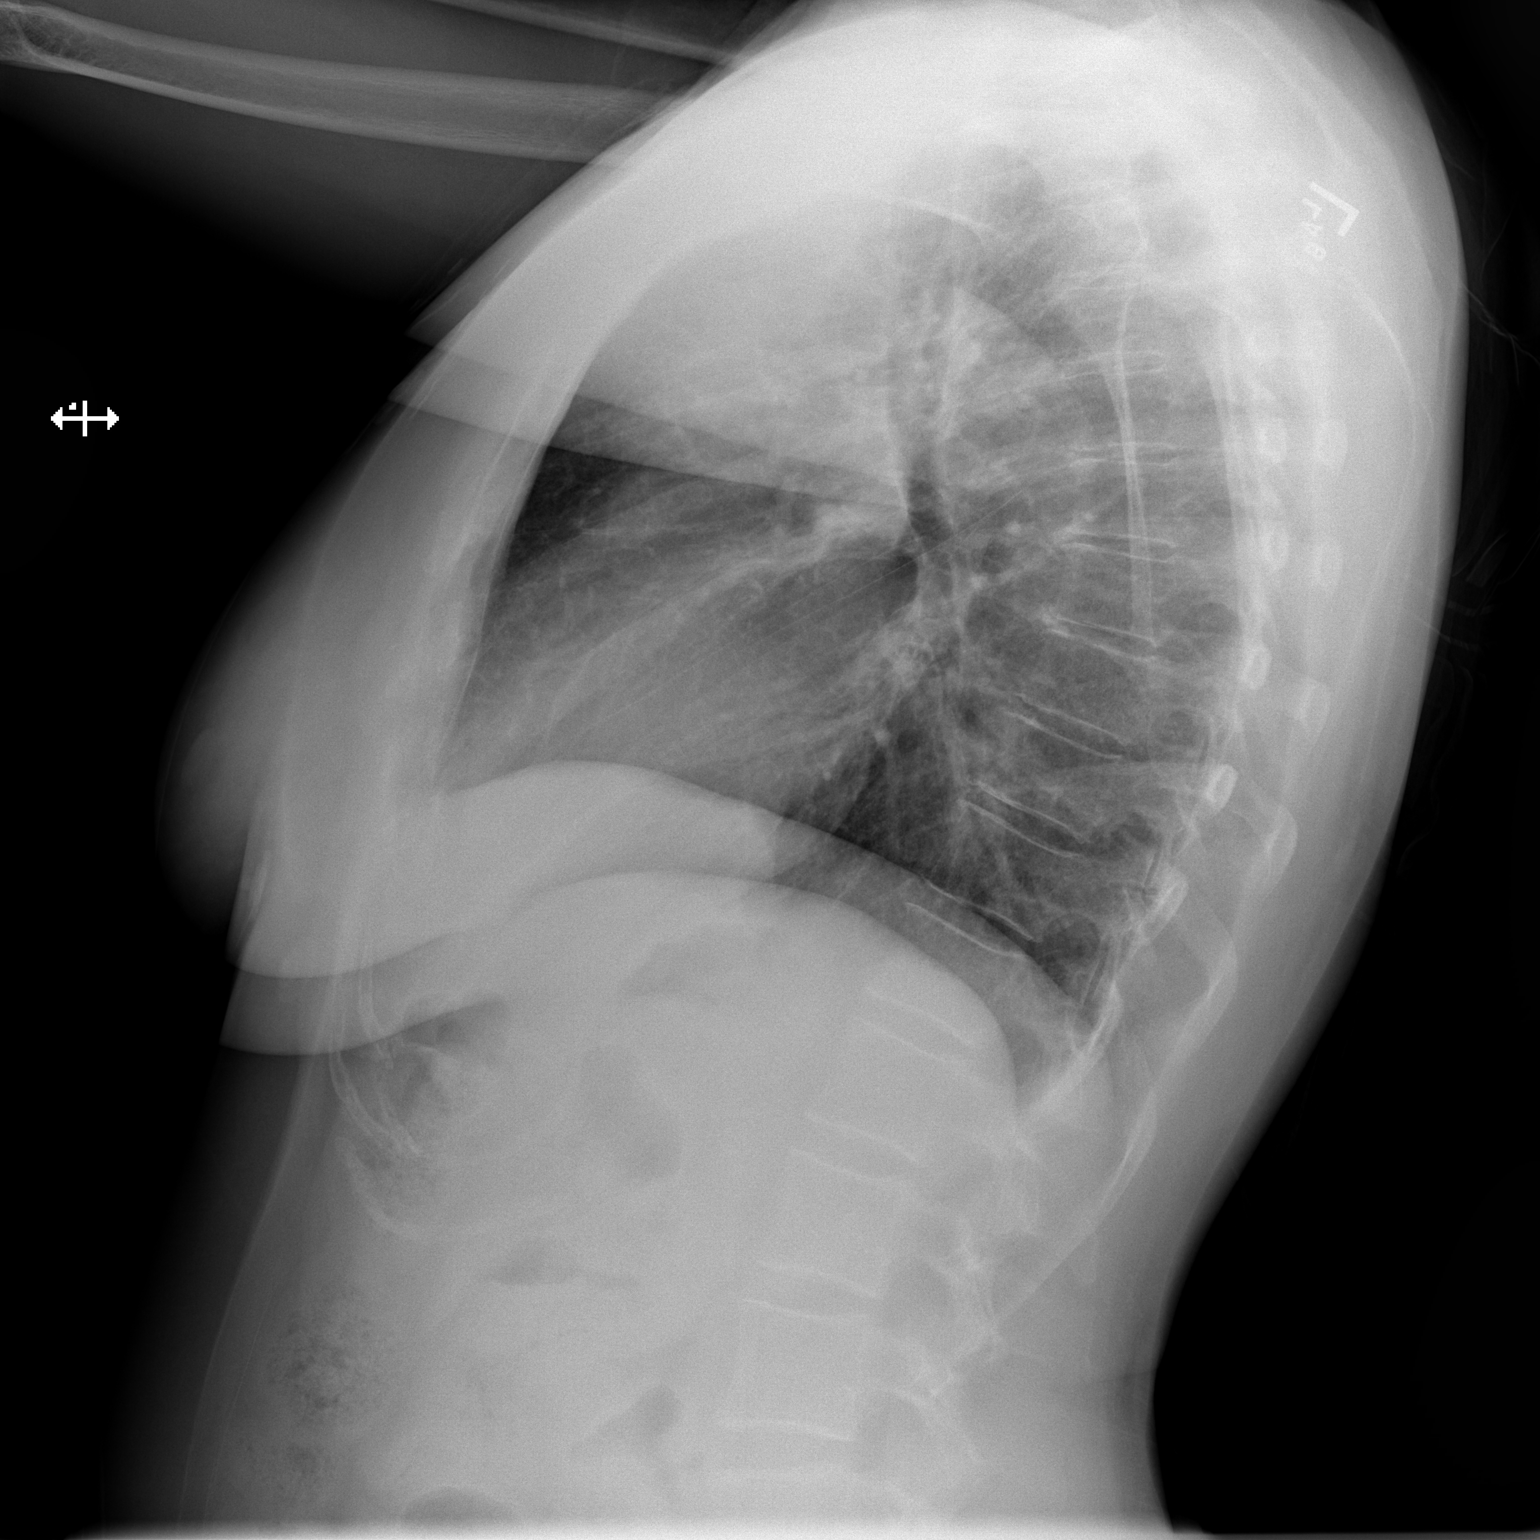

[2 of 2 positions shown; findings below may reference images not displayed]

FINDINGS: Frontal and lateral views of the chest demonstrate an unremarkable
cardiac silhouette. No acute airspace disease, effusion, or
pneumothorax. No acute bony abnormalities.
IMPRESSION: 1. Stable chest, no acute process.
